# Patient Record
Sex: Female | Born: 1975 | Hispanic: No | Marital: Single | Smoking: Never smoker
Health system: Southern US, Community
[De-identification: ages and names within clinical notes are randomized; demographics above are authoritative.]

## PROBLEM LIST (undated history)

## (undated) DIAGNOSIS — R569 Unspecified convulsions: Secondary | ICD-10-CM

## (undated) DIAGNOSIS — F32A Depression, unspecified: Secondary | ICD-10-CM

## (undated) DIAGNOSIS — F419 Anxiety disorder, unspecified: Secondary | ICD-10-CM

## (undated) DIAGNOSIS — F909 Attention-deficit hyperactivity disorder, unspecified type: Secondary | ICD-10-CM

## (undated) DIAGNOSIS — G4700 Insomnia, unspecified: Secondary | ICD-10-CM

## (undated) DIAGNOSIS — F329 Major depressive disorder, single episode, unspecified: Secondary | ICD-10-CM

## (undated) DIAGNOSIS — F410 Panic disorder [episodic paroxysmal anxiety] without agoraphobia: Secondary | ICD-10-CM

---
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
## 2018-01-26 ENCOUNTER — Ambulatory Visit (HOSPITAL_COMMUNITY)
Admission: EM | Admit: 2018-01-26 | Discharge: 2018-01-26 | Disposition: A | Discharge status: Home / Self Care | Payer: Worker's Compensation | Attending: Internal Medicine | Admitting: Internal Medicine

## 2018-01-26 ENCOUNTER — Encounter (HOSPITAL_COMMUNITY): Payer: Self-pay

## 2018-01-26 DIAGNOSIS — M7541 Impingement syndrome of right shoulder: Secondary | ICD-10-CM | POA: Diagnosis not present

## 2018-01-26 HISTORY — DX: Anxiety disorder, unspecified: F41.9

## 2018-01-26 MED ORDER — NAPROXEN 500 MG PO TABS: 500.0000 mg | ORAL_TABLET | Freq: Two times a day (BID) | ORAL | 0 refills | Status: AC

## 2018-01-26 NOTE — Discharge Instructions (Addendum)
Followup with occupational medicine Monday 9/9 to recheck.  Limited use of right arm at work:  no lifting overhead, no lifting >5 lbs.  Prescription for naproxen (anti inflammatory/pain reliever) sent to the pharmacy.  Ice for 5-10 minutes several times daily may also help decrease pain.

## 2018-01-26 NOTE — ED Provider Notes (Signed)
[facility]    CSN: [id number] Arrival date & time: 01/26/18  1810     History   Chief Complaint Chief Complaint  Patient presents with  . Arm Pain    HPI Carrie George is a 42 y.o. female.   She presents today with pain in the right shoulder that has increased since she moved a box on Monday 9/2.  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  At that time she had some discomfort in the right wrist, which has subsided.  She woke up the following day, Tuesday, with severe pain in her right shoulder and difficulty raising her arm overhead.  No other injury reported.  She has not had pain or difficulty with the right neck or shoulder before.    HPI  Past Medical History:  Diagnosis Date  . Anxiety     History reviewed. No pertinent surgical history.   Home Medications    Prior to Admission medications   Medication Sig Start Date End Date Taking? Authorizing Provider  amphetamine-dextroamphetamine (ADDERALL XR) 10 MG 24 hr capsule Take 10 mg by mouth daily.   Yes [provider]  diazepam (VALIUM) 1 MG/ML solution Take by mouth every 8 (eight) hours as needed for anxiety.   Yes [provider]  sertraline (ZOLOFT) 100 MG tablet Take 100 mg by mouth daily.   Yes [provider]  traZODone (DESYREL) 100 MG tablet Take 100 mg by mouth at bedtime.   Yes [provider]  naproxen (NAPROSYN) 500 MG tablet Take 1 tablet (500 mg total) by mouth 2 (two) times daily. 01/26/18   Isa Rankin, MD    Family History Family History  Problem Relation Age of Onset  . Arthritis Mother   . Hypertension Father     Social History Social History   Tobacco Use  . Smoking status: Never Smoker  . Smokeless tobacco: Never Used  Substance Use Topics  . Alcohol use: Never    Frequency: Never  . Drug use: Not on file     Allergies     Benzoyl peroxide and Penicillins   Review of Systems Review of Systems  All other systems reviewed and are negative.    Physical Exam Triage Vital Signs ED Triage Vitals [01/26/18 1857]  Enc Vitals Group     BP (!) 155/86     Pulse Rate 64     Resp 18     Temp 98 F (36.7 C)     Temp src      SpO2 100 %     Weight      Height      Pain Score 6     Pain Loc    Updated Vital Signs BP (!) 155/86   Pulse 64   Temp 98 F (36.7 C)   Resp 18   LMP 12/26/2017   SpO2 100%  Physical Exam  Constitutional: She is oriented to person, place, and time. No distress.  HENT:  Head: Atraumatic.  Eyes:  Conjugate gaze observed, no eye redness/discharge  Neck: Neck supple.  Cardiovascular: Normal rate.  Pulmonary/Chest: No respiratory distress.  Abdominal: She exhibits no distension.  Musculoskeletal: Normal range of motion.  She is not able to actively or passively fully extend the right arm of the shoulder overhead.  This is due to pain.  External rotation and  internal rotation are very painful. Range of motion is preserved at the right elbow, wrist and hand. Range of motion at the neck is full and unrestricted/nonpainful including lateral rotation, lateral flexion, forward flexion and extension.  Neurological: She is alert and oriented to person, place, and time.  Skin: Skin is warm and dry.  Nursing note and vitals reviewed.     Final Clinical Impressions(s) / UC Diagnoses   Final diagnoses:  Rotator cuff impingement syndrome of right shoulder     Discharge Instructions     Followup with occupational medicine Monday 9/9 to recheck.  Limited use of right arm at work:  no lifting overhead, no lifting >5 lbs.  Prescription for naproxen (anti inflammatory/pain reliever) sent to the pharmacy.  Ice for 5-10 minutes several times daily may also help decrease pain.     ED Prescriptions    Medication Sig Dispense Auth. Provider   naproxen (NAPROSYN) 500 MG tablet Take 1  tablet (500 mg total) by mouth 2 (two) times daily. 30 tablet Isa Rankin, MD        Isa Rankin, MD 01/26/18 2135

## 2018-01-26 NOTE — ED Triage Notes (Signed)
Pt presents with complaints of pain in her right wrist radiating up to her shoulder while moving boxes at work on Monday. Cms intact

## 2018-06-20 ENCOUNTER — Other Ambulatory Visit: Payer: Self-pay | Admitting: Family Medicine

## 2018-06-20 DIAGNOSIS — N6320 Unspecified lump in the left breast, unspecified quadrant: Secondary | ICD-10-CM

## 2018-07-03 ENCOUNTER — Ambulatory Visit
Admission: RE | Admit: 2018-07-03 | Discharge: 2018-07-03 | Disposition: A | Discharge status: Home / Self Care | Payer: BLUE CROSS/BLUE SHIELD | Source: Ambulatory Visit | Attending: Family Medicine | Admitting: Family Medicine

## 2018-07-03 DIAGNOSIS — N6320 Unspecified lump in the left breast, unspecified quadrant: Secondary | ICD-10-CM

## 2018-10-17 ENCOUNTER — Encounter (HOSPITAL_COMMUNITY): Payer: Self-pay

## 2018-10-17 ENCOUNTER — Other Ambulatory Visit: Payer: Self-pay

## 2018-10-17 ENCOUNTER — Emergency Department (HOSPITAL_COMMUNITY)
Admission: EM | Admit: 2018-10-17 | Discharge: 2018-10-18 | Disposition: A | Discharge status: Home / Self Care | Payer: BLUE CROSS/BLUE SHIELD | Attending: Emergency Medicine | Admitting: Emergency Medicine

## 2018-10-17 ENCOUNTER — Emergency Department (HOSPITAL_COMMUNITY): Payer: BLUE CROSS/BLUE SHIELD

## 2018-10-17 DIAGNOSIS — Z9114 Patient's other noncompliance with medication regimen: Secondary | ICD-10-CM | POA: Insufficient documentation

## 2018-10-17 DIAGNOSIS — Z20828 Contact with and (suspected) exposure to other viral communicable diseases: Secondary | ICD-10-CM | POA: Diagnosis not present

## 2018-10-17 DIAGNOSIS — X58XXXA Exposure to other specified factors, initial encounter: Secondary | ICD-10-CM | POA: Diagnosis not present

## 2018-10-17 DIAGNOSIS — Y92009 Unspecified place in unspecified non-institutional (private) residence as the place of occurrence of the external cause: Secondary | ICD-10-CM | POA: Insufficient documentation

## 2018-10-17 DIAGNOSIS — Y9389 Activity, other specified: Secondary | ICD-10-CM | POA: Insufficient documentation

## 2018-10-17 DIAGNOSIS — F13239 Sedative, hypnotic or anxiolytic dependence with withdrawal, unspecified: Secondary | ICD-10-CM | POA: Insufficient documentation

## 2018-10-17 DIAGNOSIS — R569 Unspecified convulsions: Secondary | ICD-10-CM | POA: Diagnosis not present

## 2018-10-17 DIAGNOSIS — F13939 Sedative, hypnotic or anxiolytic use, unspecified with withdrawal, unspecified: Secondary | ICD-10-CM

## 2018-10-17 DIAGNOSIS — R05 Cough: Secondary | ICD-10-CM | POA: Diagnosis not present

## 2018-10-17 DIAGNOSIS — Y999 Unspecified external cause status: Secondary | ICD-10-CM | POA: Insufficient documentation

## 2018-10-17 DIAGNOSIS — S00512A Abrasion of oral cavity, initial encounter: Secondary | ICD-10-CM | POA: Diagnosis not present

## 2018-10-17 DIAGNOSIS — R197 Diarrhea, unspecified: Secondary | ICD-10-CM | POA: Diagnosis not present

## 2018-10-17 DIAGNOSIS — M791 Myalgia, unspecified site: Secondary | ICD-10-CM | POA: Insufficient documentation

## 2018-10-17 DIAGNOSIS — R059 Cough, unspecified: Secondary | ICD-10-CM

## 2018-10-17 DIAGNOSIS — Z79899 Other long term (current) drug therapy: Secondary | ICD-10-CM | POA: Diagnosis not present

## 2018-10-17 DIAGNOSIS — S0993XA Unspecified injury of face, initial encounter: Secondary | ICD-10-CM | POA: Diagnosis present

## 2018-10-17 HISTORY — DX: Unspecified convulsions: R56.9

## 2018-10-17 HISTORY — DX: Attention-deficit hyperactivity disorder, unspecified type: F90.9

## 2018-10-17 HISTORY — DX: Panic disorder (episodic paroxysmal anxiety): F41.0

## 2018-10-17 HISTORY — DX: Depression, unspecified: F32.A

## 2018-10-17 HISTORY — DX: Insomnia, unspecified: G47.00

## 2018-10-17 LAB — COMPREHENSIVE METABOLIC PANEL
ALT: 18 U/L (ref 0–44)
AST: 25 U/L (ref 15–41)
Albumin: 4.6 g/dL (ref 3.5–5.0)
Alkaline Phosphatase: 65 U/L (ref 38–126)
Anion gap: 8 (ref 5–15)
BUN: 20 mg/dL (ref 6–20)
CO2: 25 mmol/L (ref 22–32)
Calcium: 9.8 mg/dL (ref 8.9–10.3)
Chloride: 106 mmol/L (ref 98–111)
Creatinine, Ser: 0.73 mg/dL (ref 0.44–1.00)
GFR calc Af Amer: >60 mL/min (ref >60)
GFR calc non Af Amer: >60 mL/min (ref >60)
Glucose, Bld: 97 mg/dL (ref 70–99)
Potassium: 3.7 mmol/L (ref 3.5–5.1)
Sodium: 139 mmol/L (ref 135–145)
Total Bilirubin: 0.6 mg/dL (ref 0.3–1.2)
Total Protein: 7.7 g/dL (ref 6.5–8.1)

## 2018-10-17 LAB — URINALYSIS, ROUTINE W REFLEX MICROSCOPIC
Bilirubin Urine: NEGATIVE
Glucose, UA: NEGATIVE mg/dL
Ketones, ur: NEGATIVE mg/dL
Leukocytes,Ua: NEGATIVE
Nitrite: NEGATIVE
Protein, ur: NEGATIVE mg/dL
Specific Gravity, Urine: 1.029 (ref 1.005–1.030)
pH: 5 (ref 5.0–8.0)

## 2018-10-17 LAB — I-STAT BETA HCG BLOOD, ED (MC, WL, AP ONLY): I-stat hCG, quantitative: <5 m[IU]/mL (ref <5)

## 2018-10-17 LAB — CBC WITH DIFFERENTIAL/PLATELET
Abs Immature Granulocytes: 0.05 10*3/uL (ref 0.00–0.07)
Basophils Absolute: 0.1 10*3/uL (ref 0.0–0.1)
Basophils Relative: 1 %
Eosinophils Absolute: 0.1 10*3/uL (ref 0.0–0.5)
Eosinophils Relative: 1 %
HCT: 45.4 % (ref 36.0–46.0)
Hemoglobin: 14.6 g/dL (ref 12.0–15.0)
Immature Granulocytes: 1 %
Lymphocytes Relative: 15 %
Lymphs Abs: 1.6 10*3/uL (ref 0.7–4.0)
MCH: 30.2 pg (ref 26.0–34.0)
MCHC: 32.2 g/dL (ref 30.0–36.0)
MCV: 94 fL (ref 80.0–100.0)
Monocytes Absolute: 0.9 10*3/uL (ref 0.1–1.0)
Monocytes Relative: 8 %
Neutro Abs: 8.2 10*3/uL — ABNORMAL HIGH (ref 1.7–7.7)
Neutrophils Relative %: 74 %
Platelets: 380 10*3/uL (ref 150–400)
RBC: 4.83 MIL/uL (ref 3.87–5.11)
RDW: 12.3 % (ref 11.5–15.5)
WBC: 10.9 10*3/uL — ABNORMAL HIGH (ref 4.0–10.5)
nRBC: 0 % (ref 0.0–0.2)

## 2018-10-17 LAB — RAPID URINE DRUG SCREEN, HOSP PERFORMED
Amphetamines: NOT DETECTED
Barbiturates: NOT DETECTED
Benzodiazepines: NOT DETECTED
Cocaine: NOT DETECTED
Opiates: NOT DETECTED
Tetrahydrocannabinol: POSITIVE — AB

## 2018-10-17 LAB — MAGNESIUM: Magnesium: 2.5 mg/dL — ABNORMAL HIGH (ref 1.7–2.4)

## 2018-10-17 MED ORDER — SODIUM CHLORIDE 0.9 % IV BOLUS
1000.0000 mL | Freq: Once | INTRAVENOUS | Status: AC
  Administered 2018-10-17: 22:00:00 1000 mL via INTRAVENOUS

## 2018-10-17 MED ORDER — ONDANSETRON HCL 4 MG PO TABS: 4.0000 mg | ORAL_TABLET | Freq: Four times a day (QID) | ORAL | 0 refills | Status: AC | PRN

## 2018-10-17 NOTE — Discharge Instructions (Addendum)
Do not abruptly stop taking your Ativan.

## 2018-10-17 NOTE — ED Notes (Signed)
Bed: WA25 Expected date:  Expected time:  Means of arrival:  Comments: Triage 6 

## 2018-10-17 NOTE — ED Provider Notes (Signed)
[facility]EMERGENCY [facility]PT Provider Note   CSN: [id number] Arrival date & time: 10/17/18  1823    History   Chief Complaint Chief Complaint  Patient presents with  . possible seizure  . tongue injury  . Cough  . Diarrhea    HPI Carrie George is a 43 y.o. female.     HPI Patient states she is had 3 days of watery diarrhea and nonproductive cough.  Is on home quarantine for possible exposure to COVID positive coworker.  Patient believes she may have had a seizure overnight.  States she is has abrasions to the side of her tongue and complains of muscular pain to her thighs.  States that several years ago she had a seizure related to withdrawal from Klonopin.  She is not on any antiseizure medication.  States she had complete seizure work-up at that time. Past Medical History:  Diagnosis Date  . ADHD   . Anxiety   . Depression   . Insomnia   . Panic attack   . Seizures (HCC)     There are no active problems to display for this patient.   History reviewed. No pertinent surgical history.   OB History   No obstetric history on file.      Home Medications    Prior to Admission medications   Medication Sig Start Date End Date Taking? Authorizing Provider  amphetamine-dextroamphetamine (ADDERALL XR) 10 MG 24 hr capsule Take 10 mg by mouth daily.    [provider]  diazepam (VALIUM) 1 MG/ML solution Take by mouth every 8 (eight) hours as needed for anxiety.    [provider]  naproxen (NAPROSYN) 500 MG tablet Take 1 tablet (500 mg total) by mouth 2 (two) times daily. 01/26/18   Isa RankinMurray, Laura Wilson, MD  ondansetron (ZOFRAN) 4 MG tablet Take 1 tablet (4 mg total) by mouth every 6 (six) hours as needed for nausea or vomiting. 10/17/18   Loren RacerYelverton, Makeba Delcastillo, MD  sertraline (ZOLOFT) 100 MG tablet Take 100 mg by mouth daily.    [provider]  traZODone (DESYREL) 100 MG tablet Take 100 mg by mouth at bedtime.    [provider]    Family History Family History  Problem Relation Age of Onset  . Arthritis Mother   . Hypertension Father     Social History Social History   Tobacco Use  . Smoking status: Never Smoker  . Smokeless tobacco: Never Used  Substance Use Topics  . 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 Drug use: Yes    Types: Marijuana    Comment: 3-4 times a week     Allergies   Benzoyl peroxide and Penicillins   Review of Systems Review of Systems  Constitutional: Negative for chills and fever.  HENT: Negative for trouble swallowing.   Eyes: Negative for visual disturbance.  Respiratory: Positive for cough. Negative for shortness of breath.   Cardiovascular: Negative for chest pain.  Gastrointestinal: Positive for diarrhea. Negative for constipation, nausea and vomiting.  Genitourinary: Negative for flank pain and frequency.  Musculoskeletal: Positive for myalgias. Negative for back pain and neck pain.  Skin: Negative for rash and wound.  Neurological: Negative for dizziness, weakness, light-headedness, numbness and headaches.  All other systems reviewed and are negative.    Physical Exam Updated Vital Signs BP 130/88 (BP Location: Right Arm)   Pulse 61   Temp 98.9 F (37.2 C) (  Oral)   Resp 15   Ht 5\' 8"  (1.727 m)   Wt 75.8 kg   LMP 09/21/2018 (Approximate)   SpO2 100%   BMI 25.39 kg/m   Physical Exam Vitals signs and nursing note reviewed.  Constitutional:      General: She is not in acute distress.    Appearance: Normal appearance. She is well-developed. She is not ill-appearing.  HENT:     Head: Normocephalic and atraumatic.     Nose: Nose normal.     Mouth/Throat:     Comments: Abrasions to the left lateral surface of the tongue. Eyes:     Extraocular Movements: Extraocular movements intact.     Pupils: Pupils are equal, round, and reactive to light.  Neck:     Musculoskeletal: Normal range of motion  and neck supple. No neck rigidity or muscular tenderness.     Comments: No meningismus Cardiovascular:     Rate and Rhythm: Normal rate and regular rhythm.     Heart sounds: No murmur. No friction rub. No gallop.   Pulmonary:     Effort: Pulmonary effort is normal. No respiratory distress.     Breath sounds: Normal breath sounds. No stridor. No wheezing, rhonchi or rales.  Chest:     Chest wall: No tenderness.  Abdominal:     General: Bowel sounds are normal.     Palpations: Abdomen is soft.     Tenderness: There is no abdominal tenderness. There is no guarding or rebound.  Musculoskeletal: Normal range of motion.        General: No swelling, tenderness, deformity or signs of injury.     Right lower leg: No edema.     Left lower leg: No edema.     Comments: Mild tenderness palpation of bilateral quadriceps.  No lower extremity swelling, asymmetry.  Distal pulses intact.  Lymphadenopathy:     Cervical: No cervical adenopathy.  Skin:    General: Skin is warm and dry.     Findings: No erythema or rash.  Neurological:     General: No focal deficit present.     Mental Status: She is alert and oriented to person, place, and time.     Comments: 5/5 motor in all extremities.  Sensation intact.  No tremor noted.  Psychiatric:        Behavior: Behavior normal.      ED Treatments / Results  Labs (all labs ordered are listed, but only abnormal results are displayed) Labs Reviewed  CBC WITH DIFFERENTIAL/PLATELET - Abnormal; Notable for the following components:      Result Value   WBC 10.9 (*)    Neutro Abs 8.2 (*)    All other components within normal limits  MAGNESIUM - Abnormal; Notable for the following components:   Magnesium 2.5 (*)    All other components within normal limits  URINALYSIS, ROUTINE W REFLEX MICROSCOPIC - Abnormal; Notable for the following components:   APPearance TURBID (*)    Hgb urine dipstick MODERATE (*)    Bacteria, UA RARE (*)    All other components  within normal limits  RAPID URINE DRUG SCREEN, HOSP PERFORMED - Abnormal; Notable for the following components:   Tetrahydrocannabinol POSITIVE (*)    All other components within normal limits  SARS CORONAVIRUS 2 (HOSPITAL ORDER, PERFORMED IN [facility])  COMPREHENSIVE METABOLIC PANEL  I-STAT BETA HCG BLOOD, ED ([facility], [facility], [facility] ONLY)    EKG EKG Interpretation  Date/Time:  Wednesday Oct 17 2018  20:23:11 EDT Ventricular Rate:  58 PR Interval:    QRS Duration: 90 QT Interval:  416 QTC Calculation: 409 R Axis:   41 Text Interpretation:  Sinus rhythm Confirmed by Loren Racer ([id number]) on 10/17/2018 9:22:55 PM   Radiology Dg Chest Port 1 View  Result Date: 10/17/2018 CLINICAL DATA:  Cough EXAM: PORTABLE CHEST 1 VIEW COMPARISON:  None. FINDINGS: The heart size and mediastinal contours are within normal limits. Both lungs are clear. The visualized skeletal structures are unremarkable. IMPRESSION: No active disease. Electronically Signed   By: Katherine Mantle M.D.   On: 10/17/2018 20:06    Procedures Procedures (including critical care time)  Medications Ordered in ED Medications  sodium chloride 0.9 % bolus 1,000 mL (0 mLs Intravenous Stopped 10/17/18 2307)     Initial Impression / Assessment and Plan / ED Course  I have reviewed the triage vital signs and the nursing notes.  Pertinent labs & imaging results that were available during my care of the patient were reviewed by me and considered in my medical decision making (see chart for details).       Patient takes Ativan regularly as prescribed by her psychiatrist.  States she has not had any in the last 2 days.  Suspect symptoms may be related to benzodiazepine withdrawal.  Vital signs remained stable.  Advised not to abruptly stop taking her Ativan.  Strict return precautions given. Final Clinical Impressions(s) / ED Diagnoses   Final diagnoses:  Benzodiazepine withdrawal with complication (HCC)  Cough     ED Discharge Orders         Ordered    ondansetron (ZOFRAN) 4 MG tablet  Every 6 hours PRN     10/17/18 2328           Loren Racer, MD 10/17/18 2329

## 2018-10-17 NOTE — ED Triage Notes (Addendum)
Patient reports that she thinks she had a seizure during the night because she has tongue injury and her legs feel like she has ran for a long time. Patient does have a history of a seizure x 1 only. Patient reports a fever with the highest being 99.5, a non productive cough, and diarrhea x 3-4 days ago.  Patient added during triage that she has been around a person at work who was postive for Covid -19. Patient states her work has had her quarantine at home.

## 2018-10-18 LAB — SARS CORONAVIRUS 2 BY RT PCR (HOSPITAL ORDER, PERFORMED IN ~~LOC~~ HOSPITAL LAB): SARS Coronavirus 2: NEGATIVE

## 2019-01-21 ENCOUNTER — Other Ambulatory Visit: Payer: Self-pay

## 2019-01-21 ENCOUNTER — Ambulatory Visit: Payer: Self-pay

## 2019-01-21 ENCOUNTER — Other Ambulatory Visit: Payer: Self-pay | Admitting: Occupational Medicine

## 2019-01-21 DIAGNOSIS — M545 Low back pain, unspecified: Secondary | ICD-10-CM

## 2020-03-02 IMAGING — DX LUMBAR SPINE - COMPLETE 4+ VIEW
5 series · 5 of 5 positions shown · non-contrast
Comparison: None.

CLINICAL DATA: Fall 12/31/2018.  Persistent low back pain.

EXAM:
LUMBAR SPINE - COMPLETE 4+ VIEW

[l-spine ap]
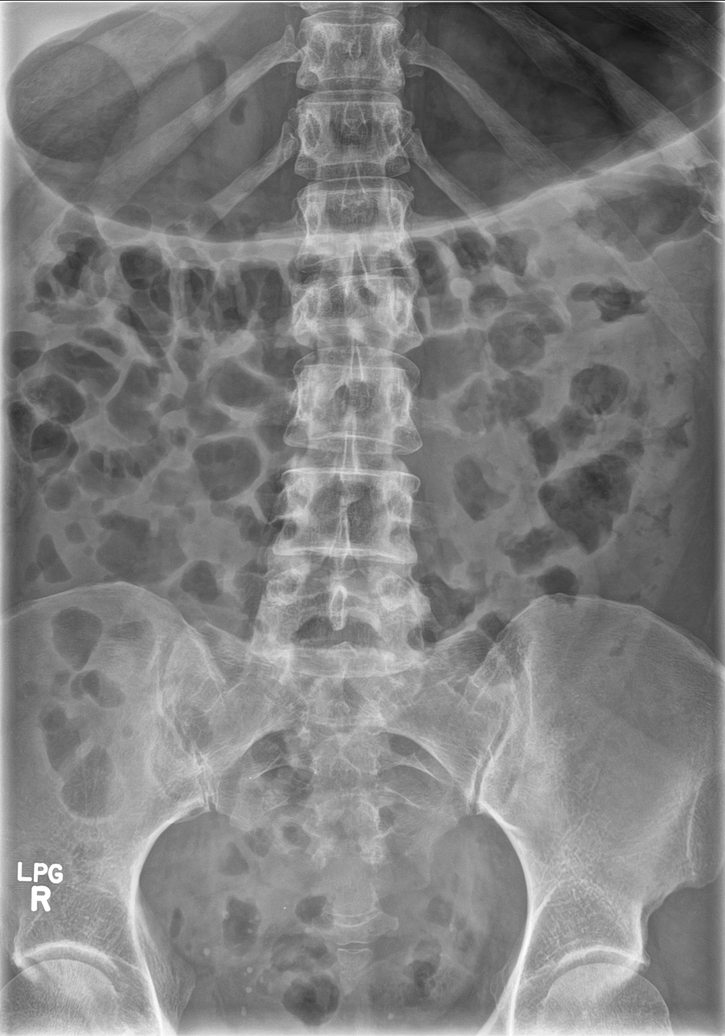

[l-spine obl (1 of 2)]
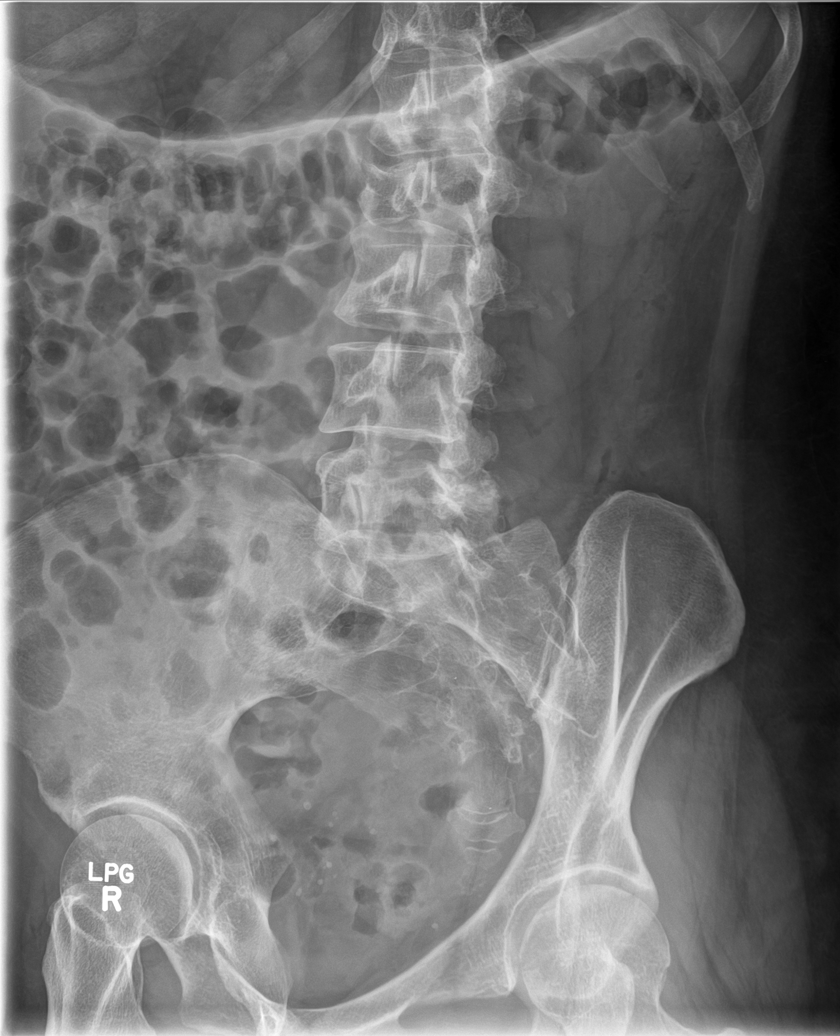

[l-spine obl (2 of 2)]
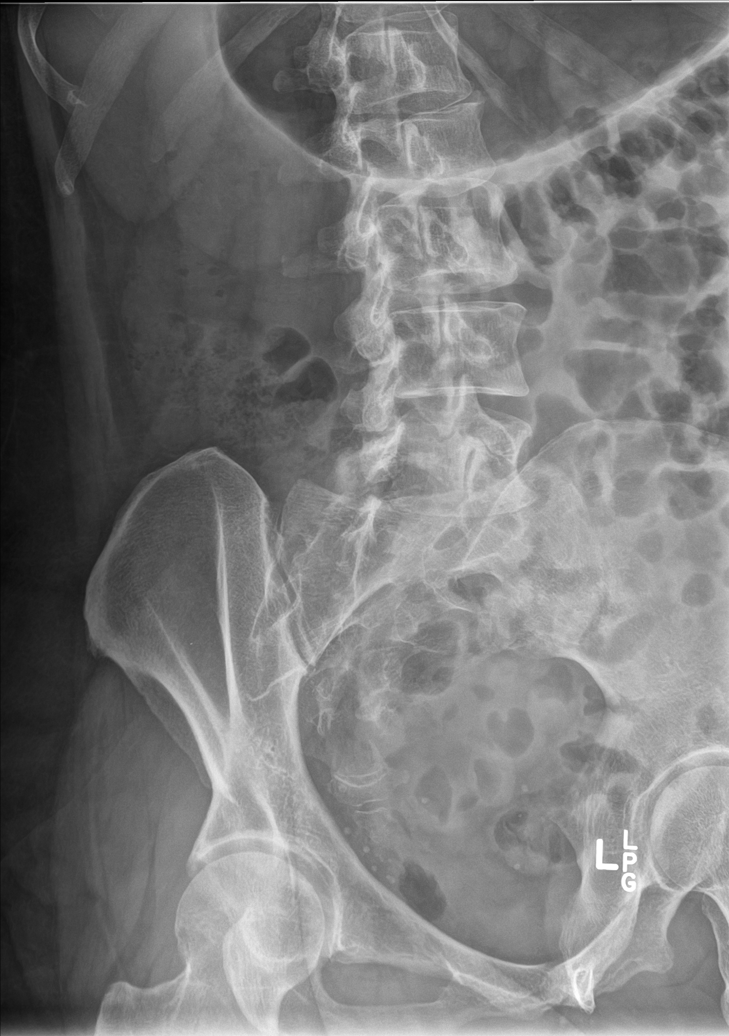

[l-spine lat]
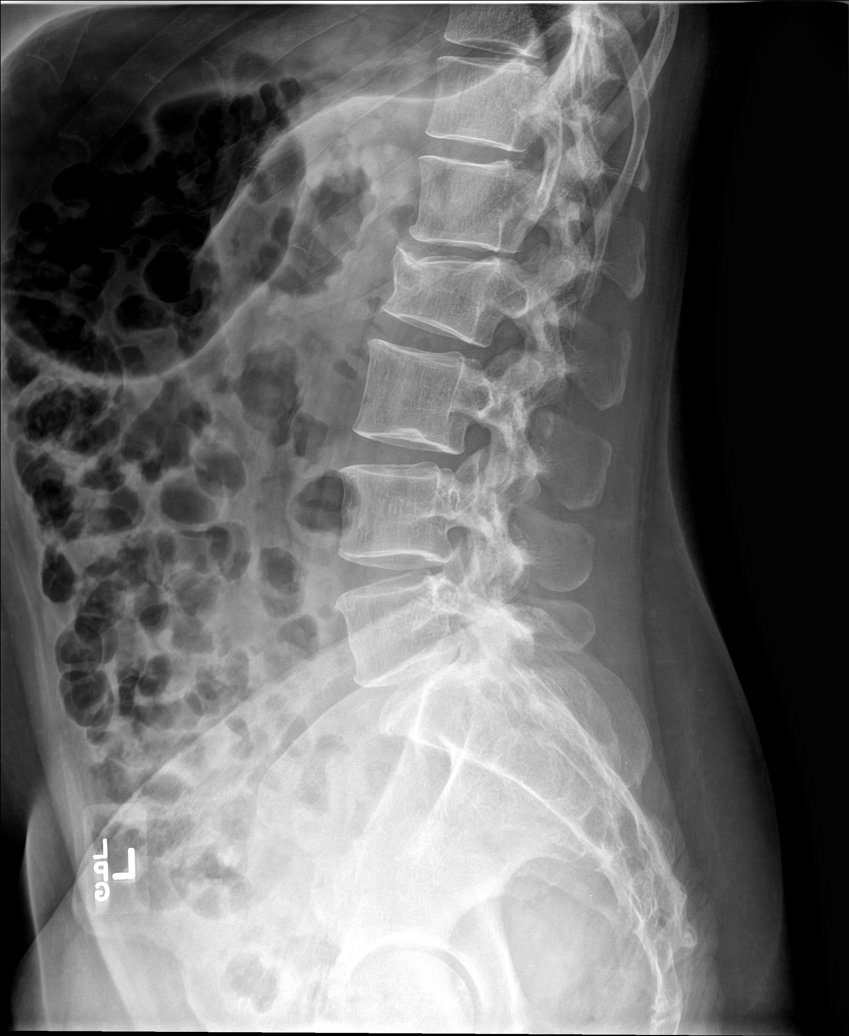

[l-spine spot]
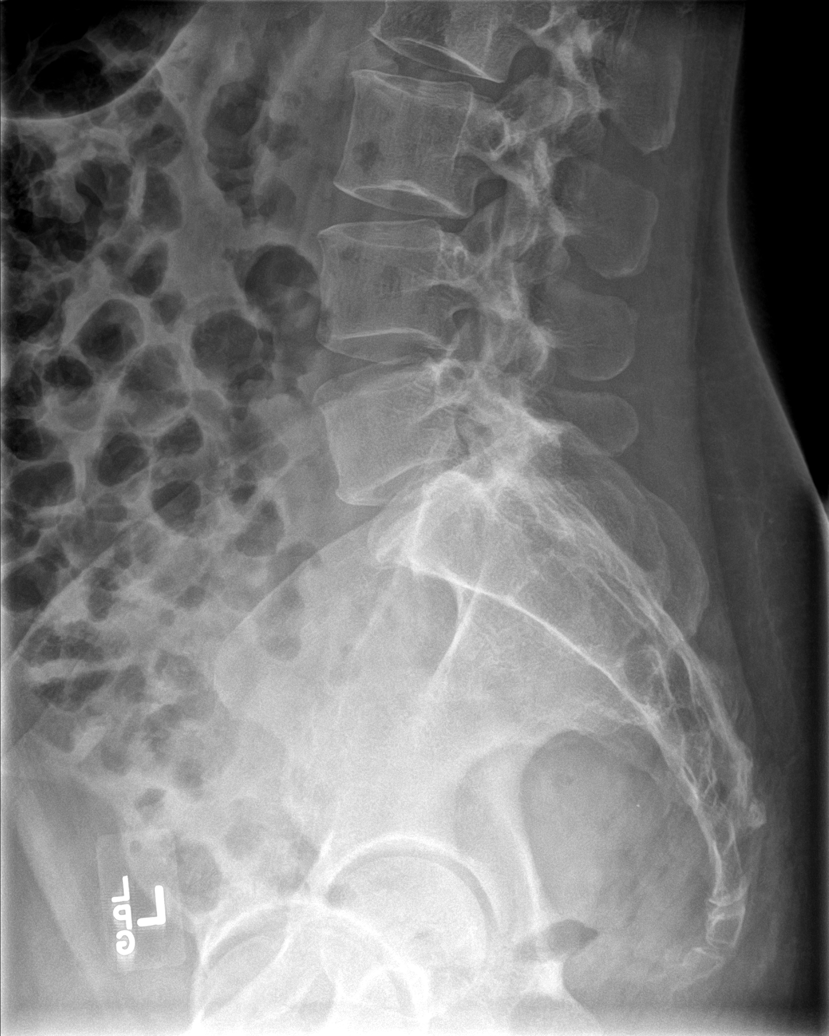

[5 of 5 positions shown; findings below may reference images not displayed]

FINDINGS: L2 anterior compression fracture is worse right than left. There is
greater than 30% loss of height. No definite retropulsed bone is
present. Vertebral body heights are otherwise maintained.

There is moderate gaseous distention of the stomach.
IMPRESSION: 1. Anterior superior endplate fracture at L2 with greater than 30%
loss of height.
2. Moderate distention of the stomach.
